# Patient Record
Sex: Male | Born: 1990 | Race: White | Hispanic: No | Marital: Single
Health system: Midwestern US, Academic
[De-identification: ages and names within clinical notes are randomized; demographics above are authoritative.]

---

## 2017-02-25 ENCOUNTER — Inpatient Hospital Stay
Admit: 2017-02-25 | Discharge: 2017-02-28 | Disposition: A | Discharge status: Home / Self Care | Source: Other Acute Inpatient Hospital

## 2017-02-25 ENCOUNTER — Encounter: Admit: 2017-02-25 | Discharge: 2017-02-25

## 2017-02-25 ENCOUNTER — Inpatient Hospital Stay: Admit: 2017-02-25 | Discharge: 2017-02-25

## 2017-02-25 DIAGNOSIS — R69 Illness, unspecified: Principal | ICD-10-CM

## 2017-02-25 DIAGNOSIS — S68119A Complete traumatic metacarpophalangeal amputation of unspecified finger, initial encounter: ICD-10-CM

## 2017-02-25 LAB — CBC AND DIFF
Lab: 0 10*3/uL (ref 0–0.20)
Lab: 0 10*3/uL (ref 0–0.45)
Lab: 0.5 10*3/uL (ref 0–0.80)
Lab: 1.2 10*3/uL (ref 1.0–4.8)
Lab: 9.7 10*3/uL — ABNORMAL HIGH (ref >60)

## 2017-02-25 MED ORDER — ONDANSETRON HCL (PF) 4 MG/2 ML IJ SOLN
INTRAVENOUS | 0 refills | Status: DC
Start: 2017-02-25 — End: 2017-02-26
  Administered 2017-02-26: 03:00:00 4 mg via INTRAVENOUS

## 2017-02-25 MED ORDER — LIDOCAINE (PF) 10 MG/ML (1 %) IJ SOLN
.1-2 mL | INTRAMUSCULAR | 0 refills | Status: DC | PRN
Start: 2017-02-25 — End: 2017-02-26

## 2017-02-25 MED ORDER — CEFAZOLIN 1 GRAM IJ SOLR
0 refills | Status: DC
Start: 2017-02-25 — End: 2017-02-26
  Administered 2017-02-25 – 2017-02-26 (×2): 2 g via INTRAVENOUS

## 2017-02-25 MED ORDER — HYDROMORPHONE (PF) 2 MG/ML IJ SYRG
.5-1 mg | INTRAVENOUS | 0 refills | Status: DC | PRN
Start: 2017-02-25 — End: 2017-02-28

## 2017-02-25 MED ORDER — HEPARIN (PORCINE) IN 5 % DEX 20,000 UNIT/500 ML (40 UNIT/ML) IV SOLP
850 [IU]/h | INTRAVENOUS | 0 refills | Status: DC
Start: 2017-02-25 — End: 2017-02-27

## 2017-02-25 MED ORDER — BUPIVACAINE 0.125% PCA PNC SYR
PERINEURAL | 0 refills | Status: DC
Start: 2017-02-25 — End: 2017-02-26
  Administered 2017-02-26 (×2): 50.000 mL via PERINEURAL

## 2017-02-25 MED ORDER — LIDOCAINE (PF) 200 MG/10 ML (2 %) IJ SYRG
0 refills | Status: DC
Start: 2017-02-25 — End: 2017-02-26
  Administered 2017-02-25: 23:00:00 100 mg via INTRAVENOUS

## 2017-02-25 MED ORDER — HEPARIN 10000UNITS NS 100ML IRR(OR)
Freq: Once | 0 refills | Status: AC
Start: 2017-02-25 — End: ?

## 2017-02-25 MED ORDER — MIDAZOLAM 1 MG/ML IJ SOLN
INTRAVENOUS | 0 refills | Status: DC
Start: 2017-02-25 — End: 2017-02-26
  Administered 2017-02-25: 23:00:00 2 mg via INTRAVENOUS

## 2017-02-25 MED ORDER — ROPIVACAINE (PF) 5 MG/ML (0.5 %) IJ SOLN
0 refills | Status: DC
Start: 2017-02-25 — End: 2017-02-26
  Administered 2017-02-26: 04:00:00 20 mL

## 2017-02-25 MED ORDER — HEPARIN 10000UNITS NS 100ML IRR(OR)
0 refills | Status: DC
Start: 2017-02-25 — End: 2017-02-26
  Administered 2017-02-25 (×2): 100 mL

## 2017-02-25 MED ORDER — SENNOSIDES-DOCUSATE SODIUM 8.6-50 MG PO TAB
1 | Freq: Two times a day (BID) | ORAL | 0 refills | Status: DC
Start: 2017-02-25 — End: 2017-02-28
  Administered 2017-02-26 – 2017-02-28 (×5): 1 via ORAL

## 2017-02-25 MED ORDER — ASPIRIN 81 MG PO CHEW
81 mg | Freq: Every day | ORAL | 0 refills | Status: DC
Start: 2017-02-25 — End: 2017-02-27
  Administered 2017-02-26: 14:00:00 81 mg via ORAL

## 2017-02-25 MED ORDER — KETAMINE 10 MG/ML IJ SOLN (INFUSION)(AM)(OR)
0 refills | Status: DC
Start: 2017-02-25 — End: 2017-02-26
  Administered 2017-02-26: 01:00:00 200 ug/kg/h via INTRAVENOUS

## 2017-02-25 MED ORDER — PROPOFOL INJ 10 MG/ML IV VIAL
0 refills | Status: DC
Start: 2017-02-25 — End: 2017-02-26
  Administered 2017-02-25: 23:00:00 200 mg via INTRAVENOUS

## 2017-02-25 MED ORDER — LACTATED RINGERS IV SOLP
1000 mL | INTRAVENOUS | 0 refills | Status: AC
Start: 2017-02-25 — End: ?
  Administered 2017-02-25 (×2): 1000.000 mL via INTRAVENOUS
  Administered 2017-02-25: 23:00:00 1000 mL via INTRAVENOUS
  Administered 2017-02-26: 02:00:00 1000.000 mL via INTRAVENOUS
  Administered 2017-02-26: 05:00:00 1000 mL via INTRAVENOUS

## 2017-02-25 MED ORDER — HEPARIN (PORCINE) IN 5 % DEX 20,000 UNIT/500 ML (40 UNIT/ML) IV SOLP (INFUSION)(AM)(OR)
0 refills | Status: DC
Start: 2017-02-25 — End: 2017-02-26
  Administered 2017-02-26: 02:00:00 650 [IU]/h via INTRAVENOUS

## 2017-02-25 MED ORDER — KETAMINE 10 MG/ML IJ SOLN
0 refills | Status: DC
Start: 2017-02-25 — End: 2017-02-26
  Administered 2017-02-25 – 2017-02-26 (×2): 30 mg via INTRAVENOUS

## 2017-02-25 MED ORDER — OXYCODONE 5 MG PO TAB
5-10 mg | ORAL | 0 refills | Status: DC | PRN
Start: 2017-02-25 — End: 2017-02-28
  Administered 2017-02-26: 19:00:00 5 mg via ORAL
  Administered 2017-02-27: 03:00:00 10 mg via ORAL

## 2017-02-25 MED ORDER — OXYCODONE 5 MG PO TAB
5 mg | ORAL | 0 refills | Status: DC | PRN
Start: 2017-02-25 — End: 2017-02-26

## 2017-02-25 MED ORDER — LACTATED RINGERS IV SOLP
0 refills | Status: DC
Start: 2017-02-25 — End: 2017-02-26
  Administered 2017-02-25: 23:00:00 via INTRAVENOUS

## 2017-02-25 MED ORDER — AMPICILLIN/SULBACTAM 1.5G/100ML NS IVPB (MB+)
1.5 g | INTRAVENOUS | 0 refills | Status: CP
Start: 2017-02-25 — End: ?
  Administered 2017-02-26 – 2017-02-28 (×16): 1.5 g via INTRAVENOUS

## 2017-02-25 MED ORDER — SUCCINYLCHOLINE CHLORIDE 20 MG/ML IJ SOLN
INTRAVENOUS | 0 refills | Status: DC
Start: 2017-02-25 — End: 2017-02-26
  Administered 2017-02-25: 23:00:00 140 mg via INTRAVENOUS

## 2017-02-25 MED ORDER — ACETAMINOPHEN 1,000 MG/100 ML (10 MG/ML) IV SOLN
0 refills | Status: DC
Start: 2017-02-25 — End: 2017-02-26
  Administered 2017-02-26: 03:00:00 1000 mg via INTRAVENOUS

## 2017-02-25 MED ORDER — ASPIRIN 300 MG RE SUPP
0 refills | Status: DC
Start: 2017-02-25 — End: 2017-02-26
  Administered 2017-02-25: 150 mg via RECTAL

## 2017-02-25 MED ORDER — ACETAMINOPHEN 500 MG PO TAB
1000 mg | ORAL | 0 refills | Status: DC
Start: 2017-02-25 — End: 2017-02-28
  Administered 2017-02-26 – 2017-02-27 (×3): 1000 mg via ORAL
  Administered 2017-02-27: 01:00:00 500 mg via ORAL
  Administered 2017-02-27 – 2017-02-28 (×4): 1000 mg via ORAL

## 2017-02-25 MED ORDER — DEXMEDETOMIDINE IV DRIP (STD CONC)
0 refills | Status: DC
Start: 2017-02-25 — End: 2017-02-26
  Administered 2017-02-26 (×2): 0.2 ug/kg/h via INTRAVENOUS

## 2017-02-25 MED ORDER — ACETAMINOPHEN 500 MG PO TAB
1000 mg | ORAL | 0 refills | Status: DC
Start: 2017-02-25 — End: 2017-02-26
  Administered 2017-02-26: 06:00:00 500 mg via ORAL

## 2017-02-25 MED ORDER — FENTANYL CITRATE (PF) 50 MCG/ML IJ SOLN
0 refills | Status: DC
Start: 2017-02-25 — End: 2017-02-26
  Administered 2017-02-25: 50 ug via INTRAVENOUS
  Administered 2017-02-25 (×2): 100 ug via INTRAVENOUS
  Administered 2017-02-26: 25 ug via INTRAVENOUS
  Administered 2017-02-26: 01:00:00 75 ug via INTRAVENOUS

## 2017-02-25 MED ORDER — HYDROXYZINE HCL 25 MG PO TAB
25 mg | ORAL | 0 refills | Status: DC | PRN
Start: 2017-02-25 — End: 2017-02-28

## 2017-02-25 MED ORDER — HEPARIN (PORCINE) 1,000 UNIT/ML IJ SOLN
4000 [IU] | Freq: Once | INTRAVENOUS | 0 refills | Status: CP | PRN
Start: 2017-02-25 — End: ?
  Administered 2017-02-26: 02:00:00 4000 [IU] via INTRAVENOUS

## 2017-02-25 MED ORDER — KETOROLAC 15 MG/ML IJ SOLN
15 mg | INTRAVENOUS | 0 refills | Status: DC
Start: 2017-02-25 — End: 2017-02-28
  Administered 2017-02-26 – 2017-02-28 (×8): 15 mg via INTRAVENOUS

## 2017-02-25 MED ORDER — LIDOCAINE HCL 4 % (40 MG/ML) MM SOLN
0 refills | Status: DC
Start: 2017-02-25 — End: 2017-02-26
  Administered 2017-02-25: 10 mL via TOPICAL

## 2017-02-25 MED ORDER — POLYETHYLENE GLYCOL 3350 17 GRAM PO PWPK
17 g | Freq: Every day | ORAL | 0 refills | Status: DC
Start: 2017-02-25 — End: 2017-02-28
  Administered 2017-02-28: 15:00:00 17 g via ORAL

## 2017-02-25 MED ORDER — LACTATED RINGERS IV SOLP
INTRAVENOUS | 0 refills | Status: AC
Start: 2017-02-25 — End: ?
  Administered 2017-02-26 – 2017-02-27 (×5): 1000.000 mL via INTRAVENOUS

## 2017-02-25 MED ORDER — DIPHENHYDRAMINE HCL 50 MG/ML IJ SOLN
25 mg | INTRAVENOUS | 0 refills | Status: DC | PRN
Start: 2017-02-25 — End: 2017-02-28

## 2017-02-25 MED ORDER — ONDANSETRON HCL (PF) 4 MG/2 ML IJ SOLN
4 mg | INTRAVENOUS | 0 refills | Status: DC | PRN
Start: 2017-02-25 — End: 2017-02-28

## 2017-02-26 LAB — BASIC METABOLIC PANEL
Lab: 139 MMOL/L — ABNORMAL LOW (ref >60)
Lab: 3.4 MMOL/L — ABNORMAL LOW (ref >60)

## 2017-02-26 LAB — PTT (APTT): Lab: 50 s — ABNORMAL HIGH (ref 24.0–36.5)

## 2017-02-26 LAB — CBC
Lab: 3.6 M/UL — ABNORMAL LOW (ref 4.4–5.5)
Lab: 9.8 K/UL — ABNORMAL HIGH (ref 4.5–11.0)

## 2017-02-26 MED ORDER — POTASSIUM CHLORIDE IN WATER 10 MEQ/50 ML IV PGBK
10 meq | INTRAVENOUS | 0 refills | Status: CP
Start: 2017-02-26 — End: ?
  Administered 2017-02-26 (×6): 10 meq via INTRAVENOUS

## 2017-02-26 MED ORDER — BUPIVACAINE 0.125% PCA PNC SYR
PERINEURAL | 0 refills | Status: DC
Start: 2017-02-26 — End: 2017-02-28
  Administered 2017-02-26 – 2017-02-28 (×9): 50.000 mL via PERINEURAL

## 2017-02-26 MED ORDER — LORAZEPAM 2 MG/ML IJ SOLN
1 mg | Freq: Once | INTRAVENOUS | 0 refills | Status: CP
Start: 2017-02-26 — End: ?
  Administered 2017-02-26: 08:00:00 1 mg via INTRAVENOUS

## 2017-02-27 ENCOUNTER — Encounter: Admit: 2017-02-27 | Discharge: 2017-02-27

## 2017-02-27 ENCOUNTER — Inpatient Hospital Stay: Admit: 2017-02-27 | Discharge: 2017-02-27

## 2017-02-27 LAB — BASIC METABOLIC PANEL: Lab: 140 MMOL/L — ABNORMAL LOW (ref 137–147)

## 2017-02-27 LAB — CBC: Lab: 6.6 K/UL — ABNORMAL HIGH (ref 4.5–11.0)

## 2017-02-27 MED ORDER — FENTANYL CITRATE (PF) 50 MCG/ML IJ SOLN
50 ug | INTRAVENOUS | 0 refills | Status: DC | PRN
Start: 2017-02-27 — End: 2017-02-28
  Administered 2017-02-28 (×2): 25 ug via INTRAVENOUS

## 2017-02-27 MED ORDER — PROPOFOL INJ 10 MG/ML IV VIAL
0 refills | Status: DC
Start: 2017-02-27 — End: 2017-02-28
  Administered 2017-02-27: 23:00:00 300 mg via INTRAVENOUS

## 2017-02-27 MED ORDER — TRAZODONE 50 MG PO TAB
50 mg | Freq: Every evening | ORAL | 0 refills | Status: DC | PRN
Start: 2017-02-27 — End: 2017-02-28
  Administered 2017-02-28: 06:00:00 50 mg via ORAL

## 2017-02-27 MED ORDER — HALOPERIDOL LACTATE 5 MG/ML IJ SOLN
1 mg | Freq: Once | INTRAVENOUS | 0 refills | Status: DC | PRN
Start: 2017-02-27 — End: 2017-02-28

## 2017-02-27 MED ORDER — MIDAZOLAM 1 MG/ML IJ SOLN
INTRAVENOUS | 0 refills | Status: DC
Start: 2017-02-27 — End: 2017-02-28
  Administered 2017-02-27: 23:00:00 2 mg via INTRAVENOUS

## 2017-02-27 MED ORDER — LACTATED RINGERS IV SOLP
1000 mL | INTRAVENOUS | 0 refills | Status: DC
Start: 2017-02-27 — End: 2017-02-28
  Administered 2017-02-27: 23:00:00 1000 mL via INTRAVENOUS

## 2017-02-27 MED ORDER — LIDOCAINE (PF) 200 MG/10 ML (2 %) IJ SYRG
0 refills | Status: DC
Start: 2017-02-27 — End: 2017-02-28
  Administered 2017-02-27: 23:00:00 100 mg via INTRAVENOUS

## 2017-02-27 MED ORDER — ONDANSETRON HCL (PF) 4 MG/2 ML IJ SOLN
INTRAVENOUS | 0 refills | Status: DC
Start: 2017-02-27 — End: 2017-02-28
  Administered 2017-02-27: 4 mg via INTRAVENOUS

## 2017-02-27 MED ORDER — ROPIVACAINE 0.2% INFUSION (ON-Q PUMP CB004/P400X2-14)
PERINEURAL | 0 refills | Status: DC
Start: 2017-02-27 — End: 2017-02-27

## 2017-02-27 MED ORDER — DEXAMETHASONE SODIUM PHOSPHATE 4 MG/ML IJ SOLN
INTRAVENOUS | 0 refills | Status: DC
Start: 2017-02-27 — End: 2017-02-28
  Administered 2017-02-27: 23:00:00 4 mg via INTRAVENOUS

## 2017-02-27 MED ORDER — DEXTRAN 70-HYPROMELLOSE (PF) 0.1-0.3 % OP DPET
0 refills | Status: DC
Start: 2017-02-27 — End: 2017-02-28
  Administered 2017-02-27: 23:00:00 2 [drp] via OPHTHALMIC

## 2017-02-27 MED ORDER — BUPIVACAINE 0.5 % (5 MG/ML) IJ SOLN
0 refills | Status: DC
Start: 2017-02-27 — End: 2017-02-28
  Administered 2017-02-27: 23:00:00 10 mL

## 2017-02-27 MED ORDER — FENTANYL CITRATE (PF) 50 MCG/ML IJ SOLN
0 refills | Status: DC
Start: 2017-02-27 — End: 2017-02-28
  Administered 2017-02-27: 23:00:00 50 ug via INTRAVENOUS

## 2017-02-27 MED ORDER — CEFAZOLIN 1 GRAM IJ SOLR
0 refills | Status: DC
Start: 2017-02-27 — End: 2017-02-28
  Administered 2017-02-27: 23:00:00 2 g via INTRAVENOUS

## 2017-02-27 MED ORDER — LIDOCAINE (PF) 10 MG/ML (1 %) IJ SOLN
.1-2 mL | INTRAMUSCULAR | 0 refills | Status: DC | PRN
Start: 2017-02-27 — End: 2017-02-28

## 2017-02-27 MED ORDER — OXYCODONE 5 MG PO TAB
5-10 mg | Freq: Once | ORAL | 0 refills | Status: DC | PRN
Start: 2017-02-27 — End: 2017-02-28

## 2017-02-28 DIAGNOSIS — S6402XA Injury of ulnar nerve at wrist and hand level of left arm, initial encounter: Principal | ICD-10-CM

## 2017-02-28 DIAGNOSIS — S68613A Complete traumatic transphalangeal amputation of left middle finger, initial encounter: ICD-10-CM

## 2017-02-28 DIAGNOSIS — S62522B Displaced fracture of distal phalanx of left thumb, initial encounter for open fracture: ICD-10-CM

## 2017-02-28 DIAGNOSIS — S62625B Displaced fracture of medial phalanx of left ring finger, initial encounter for open fracture: ICD-10-CM

## 2017-02-28 DIAGNOSIS — G8911 Acute pain due to trauma: ICD-10-CM

## 2017-02-28 DIAGNOSIS — S61012A Laceration without foreign body of left thumb without damage to nail, initial encounter: ICD-10-CM

## 2017-02-28 DIAGNOSIS — S61215A Laceration without foreign body of left ring finger without damage to nail, initial encounter: ICD-10-CM

## 2017-02-28 DIAGNOSIS — S68611A Complete traumatic transphalangeal amputation of left index finger, initial encounter: ICD-10-CM

## 2017-02-28 LAB — POC GLUCOSE: Lab: 192 mg/dL — ABNORMAL HIGH (ref 70–100)

## 2017-02-28 LAB — VRE SCREEN

## 2017-02-28 LAB — CBC
Lab: 3.3 M/UL — ABNORMAL LOW (ref 4.4–5.5)
Lab: 8.1 K/UL — ABNORMAL LOW (ref 4.5–11.0)

## 2017-02-28 LAB — BASIC METABOLIC PANEL: Lab: 139 MMOL/L — ABNORMAL LOW (ref 137–147)

## 2017-02-28 LAB — MRSA SCREEN

## 2017-02-28 MED ORDER — GABAPENTIN 300 MG PO CAP
300 mg | ORAL_CAPSULE | ORAL | 1 refills | Status: AC
Start: 2017-02-28 — End: 2017-04-11

## 2017-02-28 MED ORDER — ACETAMINOPHEN 500 MG PO TAB
1000 mg | ORAL_TABLET | ORAL | 1 refills | Status: AC | PRN
Start: 2017-02-28 — End: 2017-04-11

## 2017-02-28 MED ORDER — SENNOSIDES-DOCUSATE SODIUM 8.6-50 MG PO TAB
1 | ORAL_TABLET | Freq: Two times a day (BID) | ORAL | 1 refills | Status: AC
Start: 2017-02-28 — End: 2017-03-30

## 2017-02-28 MED ORDER — POLYETHYLENE GLYCOL 3350 17 GRAM PO PWPK
17 g | Freq: Every day | ORAL | 3 refills | 18.00000 days | Status: AC
Start: 2017-02-28 — End: 2017-03-16

## 2017-02-28 MED ORDER — AMOXICILLIN-POT CLAVULANATE 875-125 MG PO TAB
1 | ORAL_TABLET | Freq: Two times a day (BID) | ORAL | 0 refills | 7.00000 days | Status: AC
Start: 2017-02-28 — End: ?

## 2017-03-01 ENCOUNTER — Encounter: Admit: 2017-03-01 | Discharge: 2017-03-01

## 2017-03-02 ENCOUNTER — Encounter: Admit: 2017-03-02 | Discharge: 2017-03-02

## 2017-03-03 ENCOUNTER — Encounter: Admit: 2017-03-03 | Discharge: 2017-03-03

## 2017-03-07 ENCOUNTER — Encounter: Admit: 2017-03-07 | Discharge: 2017-03-07

## 2017-03-07 DIAGNOSIS — R69 Illness, unspecified: Principal | ICD-10-CM

## 2017-03-16 ENCOUNTER — Encounter: Admit: 2017-03-16 | Discharge: 2017-03-16

## 2017-03-16 ENCOUNTER — Encounter: Admit: 2017-03-16 | Discharge: 2017-04-07

## 2017-03-16 ENCOUNTER — Ambulatory Visit: Admit: 2017-03-16 | Discharge: 2017-03-16

## 2017-03-16 ENCOUNTER — Ambulatory Visit: Admit: 2017-03-16 | Discharge: 2017-03-17

## 2017-03-16 DIAGNOSIS — S6992XD Unspecified injury of left wrist, hand and finger(s), subsequent encounter: Principal | ICD-10-CM

## 2017-03-16 DIAGNOSIS — S6992XS Unspecified injury of left wrist, hand and finger(s), sequela: Principal | ICD-10-CM

## 2017-03-16 DIAGNOSIS — S68119A Complete traumatic metacarpophalangeal amputation of unspecified finger, initial encounter: Secondary | ICD-10-CM

## 2017-03-30 ENCOUNTER — Ambulatory Visit: Admit: 2017-03-30 | Discharge: 2017-03-30

## 2017-03-30 ENCOUNTER — Encounter: Admit: 2017-03-30 | Discharge: 2017-03-30

## 2017-03-30 DIAGNOSIS — S68119D Complete traumatic metacarpophalangeal amputation of unspecified finger, subsequent encounter: Principal | ICD-10-CM

## 2017-04-07 ENCOUNTER — Encounter: Admit: 2017-03-30 | Discharge: 2017-03-30

## 2017-04-07 DIAGNOSIS — S68119A Complete traumatic metacarpophalangeal amputation of unspecified finger, initial encounter: ICD-10-CM

## 2017-04-07 DIAGNOSIS — S6992XS Unspecified injury of left wrist, hand and finger(s), sequela: Principal | ICD-10-CM

## 2017-04-11 ENCOUNTER — Encounter: Admit: 2017-04-11 | Discharge: 2017-05-08

## 2017-04-11 ENCOUNTER — Ambulatory Visit: Admit: 2017-04-11 | Discharge: 2017-04-11

## 2017-04-11 ENCOUNTER — Encounter: Admit: 2017-04-11 | Discharge: 2017-04-11

## 2017-04-11 DIAGNOSIS — S6992XD Unspecified injury of left wrist, hand and finger(s), subsequent encounter: Principal | ICD-10-CM

## 2017-04-25 ENCOUNTER — Encounter: Admit: 2017-04-25 | Discharge: 2017-04-25

## 2017-05-08 DIAGNOSIS — S68119A Complete traumatic metacarpophalangeal amputation of unspecified finger, initial encounter: Secondary | ICD-10-CM

## 2017-05-08 DIAGNOSIS — S6992XD Unspecified injury of left wrist, hand and finger(s), subsequent encounter: Principal | ICD-10-CM

## 2017-05-12 ENCOUNTER — Encounter: Admit: 2017-05-12 | Discharge: 2017-05-12

## 2017-05-25 ENCOUNTER — Encounter: Admit: 2017-05-25 | Discharge: 2017-05-25

## 2017-05-25 ENCOUNTER — Encounter: Admit: 2017-05-25 | Discharge: 2017-06-07

## 2017-05-25 ENCOUNTER — Ambulatory Visit: Admit: 2017-05-25 | Discharge: 2017-05-25

## 2017-05-25 DIAGNOSIS — S68119D Complete traumatic metacarpophalangeal amputation of unspecified finger, subsequent encounter: Principal | ICD-10-CM

## 2017-05-25 DIAGNOSIS — S68119A Complete traumatic metacarpophalangeal amputation of unspecified finger, initial encounter: Principal | ICD-10-CM

## 2017-08-24 ENCOUNTER — Encounter: Admit: 2017-08-24 | Discharge: 2017-08-24

## 2017-10-17 IMAGING — CT Spine^1_C_SPINE (Adult)
1 series · 12 of 14 positions shown, 15 images · non-contrast
Comparison: none

[Series 3: c-spine 1.5 soft tissue · axial · 0.28mm/px · z∈[+180,+341]mm · 12 of 125 slices shown, 15 images]
[im 10/125  soft-tissue]
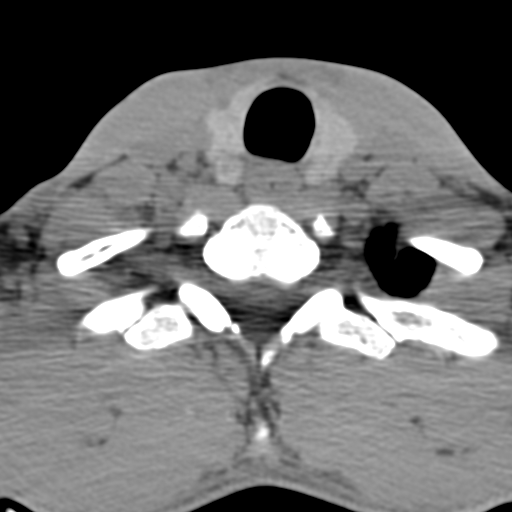
[im 10/125  bone]
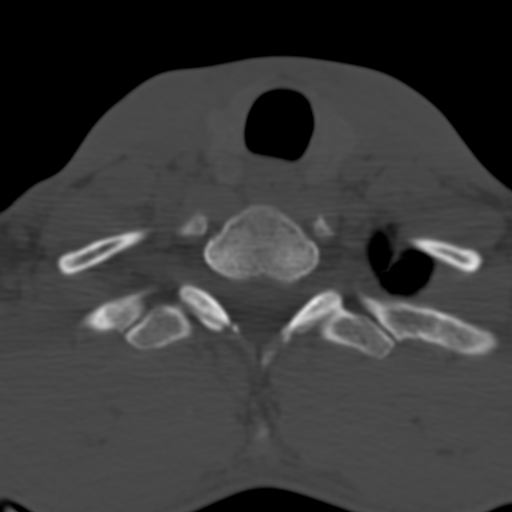
[im 20/125  bone]
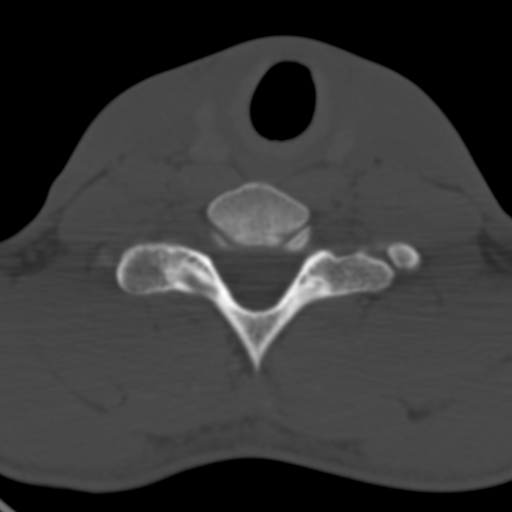
[im 29/125  bone]
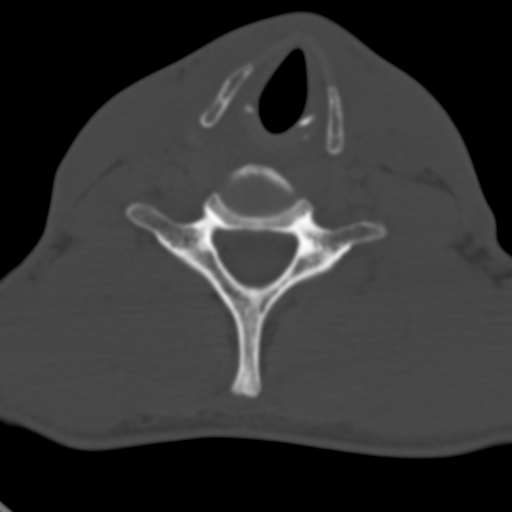
[im 39/125  bone]
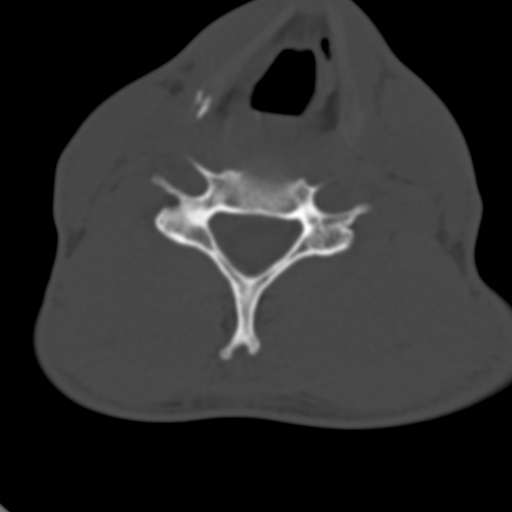
[im 48/125  soft-tissue]
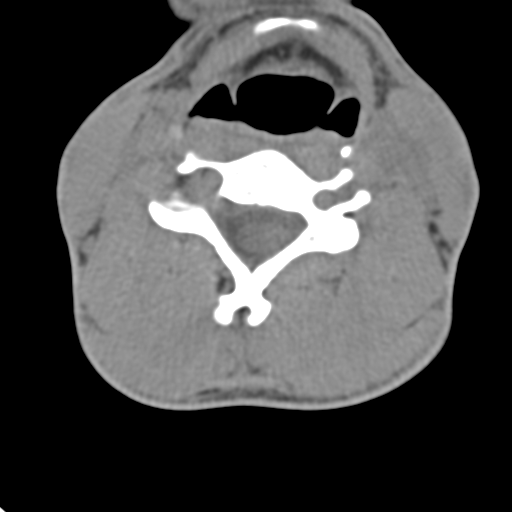
[im 48/125  bone]
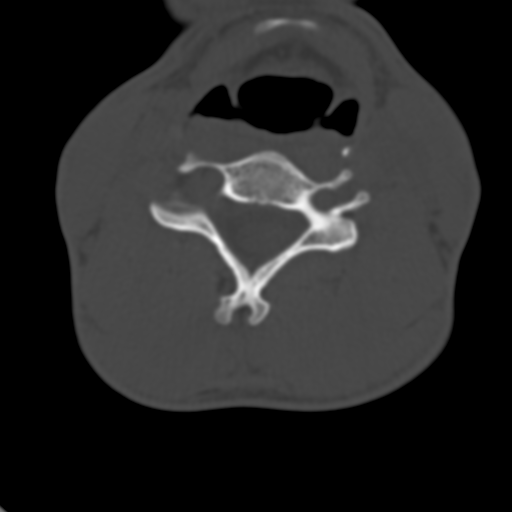
[im 58/125  bone]
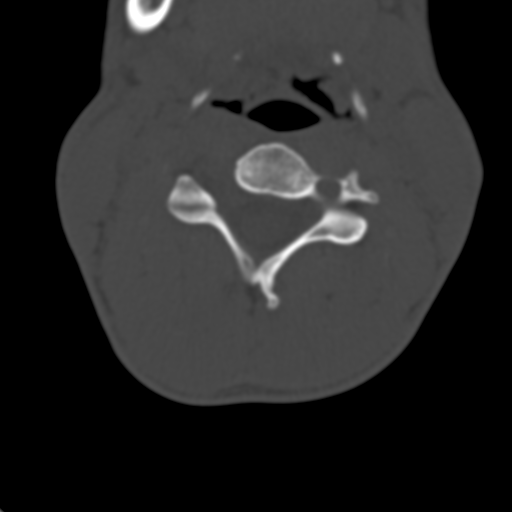
[im 67/125  bone]
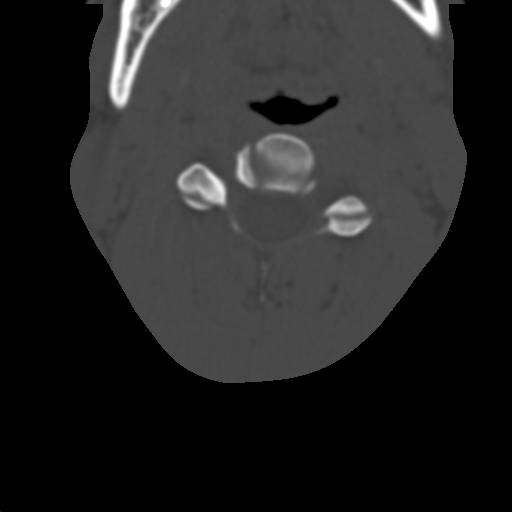
[im 77/125  bone]
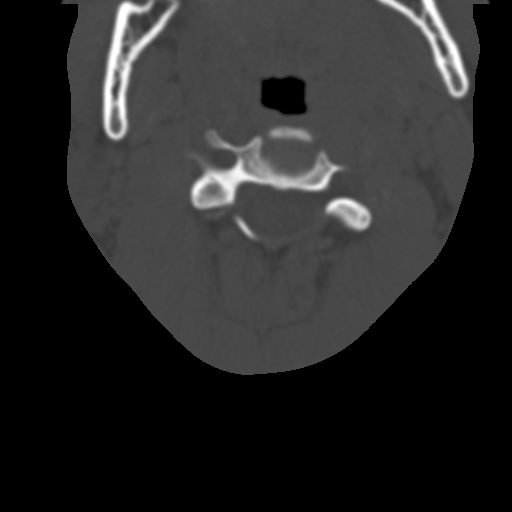
[im 86/125  soft-tissue]
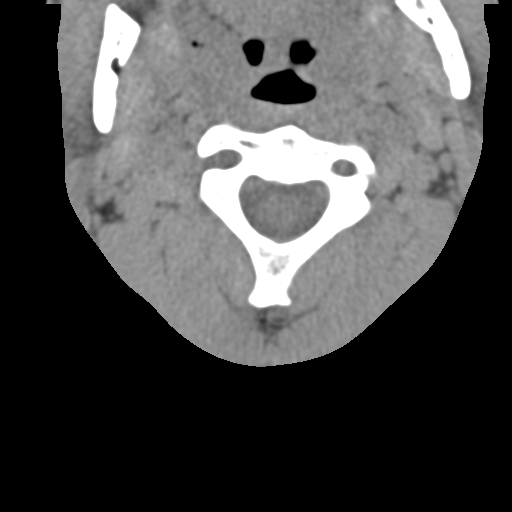
[im 86/125  bone]
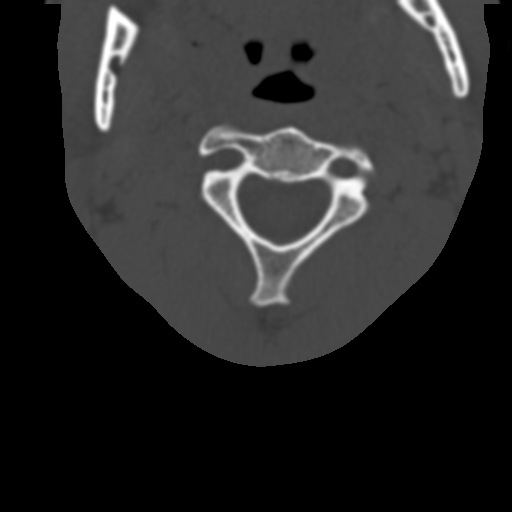
[im 96/125  bone]
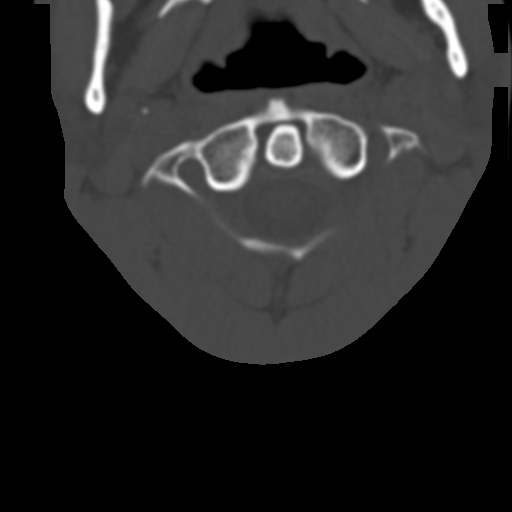
[im 105/125  bone]
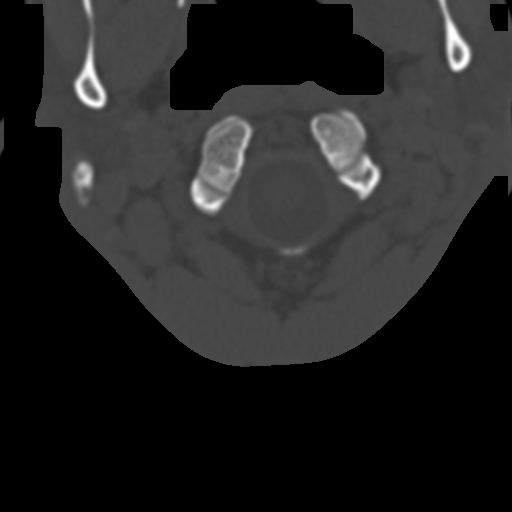
[im 115/125  bone]
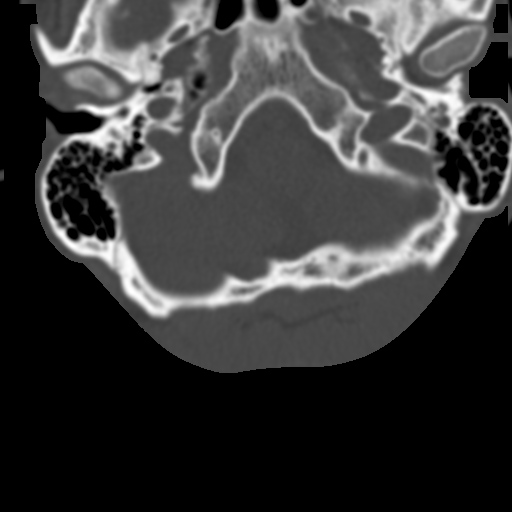

[12 of 14 positions shown; findings below may reference images not displayed]

EXAM

CT cervical spine without contrast

INDICATION

cervical neck pain, injury
PT REPORTS HEAVY PLYWOOD FELL ON HIS HEAD, CAUSING HIM TO FALL BACKWARD
FROM LADDER 15 DAYS AGO. NECK PAIN AND STIFFNESSX 2 DAYS, THAT WENT AWAY
THEN RETURNED 10 DAYS AGO AND IN INCREASING. DECREASED ROM. AB

TECHNIQUE

Volumetric multi detector CT images of the cervical spine were obtained without the administration
of IV contrast.

All CT scans at this facility use dose modulation, iterative reconstruction, and/or weight based
dosing when appropriate to reduce radiation dose to as low as reasonably achievable.

COMPARISONS

None available.

FINDINGS

The cervical vertebral body heights are grossly maintained with mild straightening of the normal
cervical lordosis. There is no evidence of displaced fracture or dislocation. There is no
significant degenerative disc disease. There is no significant spinal canal stenosis or neural
foraminal narrowing. The facets remain well imbricated. The lung apices are clear. The paraspinous
soft tissues are grossly within normal limits.

IMPRESSION

1. Mild straightening of the normal cervical lordosis which may represent position versus spasm.
No evidence of displaced fracture. If there remains persistent clinical concern or clinical
symptoms consider further evaluation with MRI.

## 2018-04-02 ENCOUNTER — Emergency Department: Admit: 2018-04-02 | Discharge: 2018-04-02

## 2018-04-02 ENCOUNTER — Encounter: Admit: 2018-04-02 | Discharge: 2018-04-02

## 2018-04-02 LAB — AMPHETAMINES-URINE RANDOM: Lab: NEGATIVE mg/dL (ref 70–100)

## 2018-04-02 LAB — URINALYSIS DIPSTICK
Lab: NEGATIVE
Lab: NEGATIVE
Lab: NEGATIVE
Lab: 8 (ref 5.0–8.0)
Lab: NEGATIVE
Lab: NEGATIVE
Lab: NEGATIVE
Lab: NEGATIVE

## 2018-04-02 LAB — SALICYLATE LEVEL

## 2018-04-02 LAB — POC TROPONIN: Lab: 0 ng/mL (ref 0.00–0.05)

## 2018-04-02 LAB — OPIATES 300 OR GREATER-URINE RANDOM: Lab: NEGATIVE U/L (ref 7–40)

## 2018-04-02 LAB — COMPREHENSIVE METABOLIC PANEL
Lab: 137 MMOL/L (ref 137–147)
Lab: 26 MMOL/L (ref 21–30)

## 2018-04-02 LAB — CANNABINOIDS-URINE RANDOM: Lab: POSITIVE mg/dL — AB (ref 8.5–10.6)

## 2018-04-02 LAB — BENZODIAZEPINES-URINE RANDOM: Lab: NEGATIVE mg/dL (ref 0.4–1.24)

## 2018-04-02 LAB — ALCOHOL LEVEL: Lab: <10 mg/dL (ref 98–110)

## 2018-04-02 LAB — ACETAMINOPHEN LEVEL: Lab: <10 ug/mL (ref <20.1)

## 2018-04-02 LAB — COCAINE-URINE RANDOM: Lab: NEGATIVE g/dL (ref 6.0–8.0)

## 2018-04-02 LAB — TSH WITH FREE T4 REFLEX: Lab: 0.8 uU/mL (ref 0.35–5.00)

## 2018-04-02 LAB — CBC AND DIFF: Lab: 7.2 10*3/uL (ref 4.5–11.0)

## 2018-04-02 LAB — METHADONE-URINE SCREEN: Lab: NEGATIVE g/dL (ref 3.5–5.0)

## 2018-04-02 LAB — URINALYSIS, MICROSCOPIC

## 2018-04-02 LAB — BARBITURATES-URINE RANDOM: Lab: NEGATIVE mg/dL (ref 7–25)

## 2018-04-02 LAB — TRICYCLIC SCREEN: Lab: NEGATIVE mL/min (ref 1.0–4.8)

## 2018-04-02 LAB — OXYCODONE URINE SCREEN: Lab: NEGATIVE U/L (ref 25–110)

## 2018-04-02 LAB — PHENCYCLIDINES-URINE RANDOM: Lab: NEGATIVE mg/dL (ref 0.3–1.2)

## 2018-04-02 MED ORDER — DIPHENHYDRAMINE HCL 12.5 MG/5 ML PO ELIX
25 mg | Freq: Once | ORAL | 0 refills | Status: CP
Start: 2018-04-02 — End: ?
  Administered 2018-04-03: 05:00:00 25 mg via ORAL

## 2018-04-02 MED ORDER — ACETAMINOPHEN 500 MG PO TAB
1000 mg | Freq: Once | ORAL | 0 refills | Status: CP
Start: 2018-04-02 — End: ?
  Administered 2018-04-02: 21:00:00 1000 mg via ORAL

## 2018-04-03 ENCOUNTER — Emergency Department: Admit: 2018-04-02 | Discharge: 2018-04-03 | Disposition: A | Discharge status: Other Acute Inpatient Hospital

## 2018-04-03 DIAGNOSIS — F329 Major depressive disorder, single episode, unspecified: ICD-10-CM

## 2018-04-03 DIAGNOSIS — R45851 Suicidal ideations: Principal | ICD-10-CM

## 2018-04-03 DIAGNOSIS — F4321 Adjustment disorder with depressed mood: ICD-10-CM

## 2018-04-03 DIAGNOSIS — F1220 Cannabis dependence, uncomplicated: ICD-10-CM

## 2018-04-03 DIAGNOSIS — F603 Borderline personality disorder: ICD-10-CM

## 2018-04-03 DIAGNOSIS — F1020 Alcohol dependence, uncomplicated: ICD-10-CM

## 2018-04-03 MED ORDER — LORAZEPAM 1 MG PO TAB
2 mg | ORAL | 0 refills | Status: CN
Start: 2018-04-03 — End: ?

## 2018-04-03 MED ORDER — LORAZEPAM 1 MG PO TAB
1 mg | ORAL | 0 refills | Status: CN
Start: 2018-04-03 — End: ?

## 2018-04-03 MED ORDER — LORAZEPAM 1 MG PO TAB
1 mg | Freq: Two times a day (BID) | ORAL | 0 refills | Status: CN
Start: 2018-04-03 — End: ?

## 2018-04-03 MED ORDER — FOLIC ACID 1 MG PO TAB
1 mg | Freq: Every day | ORAL | 0 refills | Status: DC
Start: 2018-04-03 — End: 2018-04-03
  Administered 2018-04-03: 17:00:00 1 mg via ORAL

## 2018-04-03 MED ORDER — THIAMINE MONONITRATE (VIT B1) 100 MG PO TAB
100 mg | Freq: Every day | ORAL | 0 refills | Status: DC
Start: 2018-04-03 — End: 2018-04-03
  Administered 2018-04-03: 17:00:00 100 mg via ORAL

## 2018-04-03 MED ORDER — LORAZEPAM 1 MG PO TAB
1-2 mg | ORAL | 0 refills | Status: CN | PRN
Start: 2018-04-03 — End: ?

## 2018-04-03 MED ORDER — LORAZEPAM 1 MG PO TAB
.5-1 mg | Freq: Every day | ORAL | 0 refills | Status: DC | PRN
Start: 2018-04-03 — End: 2018-04-03
  Administered 2018-04-03: 17:00:00 1 mg via ORAL

## 2018-04-03 NOTE — Progress Notes
Patient requested and received Ativan 1 mg for anxiety.  this little room is killing me, I am about to have a panic attack  Patient verbally de escalated and offered coping skills for anxiety.

## 2018-04-03 NOTE — Consults
[facility] was about 5 years ago.  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 Marital status: Single     Spouse name: Not on file   ??? Number of children: Not on file   ??? Years of education: Not on file   ??? Highest education level: Not on file   Occupational History   ??? Not on file   Tobacco Use   ??? Smoking status: Current Every Day Smoker     Packs/day: 0.50     Types: Cigarettes   ??? Smokeless tobacco: Never Used   Substance and Sexual Activity   ??? Alcohol use: Yes     Comment: rare   ??? Drug use: Yes     Types: Marijuana     Comment: Intermittent cocaine in history   ??? Sexual activity: Not on file   Other Topics Concern   ??? Not on file   Social History Narrative   ??? Not on file         Past Medical/Surgical History:  No past medical history on file.:    Surgical History:   Procedure Laterality Date   ??? OPEN TREATMENT OF ARTICULAR FRACTURE LEFT THUMB DISTAL PHALANX, DEBRIDEMENT SKIN, FASCIA, BONE OF LEFT THUMB AND LEFT RING FINGER OPEN FRACUTURE, REPLANTATION LEFT INDEX FINGER, REPAIR ULNAR DIGITAL NERVE THUMB, AMPUTATION LEFT LONG FINGER WITH LOCAL FLAP, OPEN TREATMENT OF

## 2018-04-03 NOTE — ED Notes
[facility] [employer] contacted for transportation, over 8 hour wait time. [facility] contacted for transportation.  Estimated pickup at 1415-1430.

## 2018-04-03 NOTE — ED Notes
He was transported to [facility] by [facility]. He had his belongings and he went through his belongings before leaving. AWAS score of 3 when he left.

## 2018-04-03 NOTE — Progress Notes
PRN Medication Administered:  Ativan 1 mg  Non pharmacological interventions attempted prior to PRN medication administration:  verbal de escalation and one to one intervention with staff.    Brief narrative of reason for PRN medication administration:   Patient was anxious about being in this little room for a long time and I might have a panic attack

## 2018-10-06 IMAGING — CR UP_EXM
2 series · 2 of 2 positions shown · non-contrast
Comparison: none

[hand]
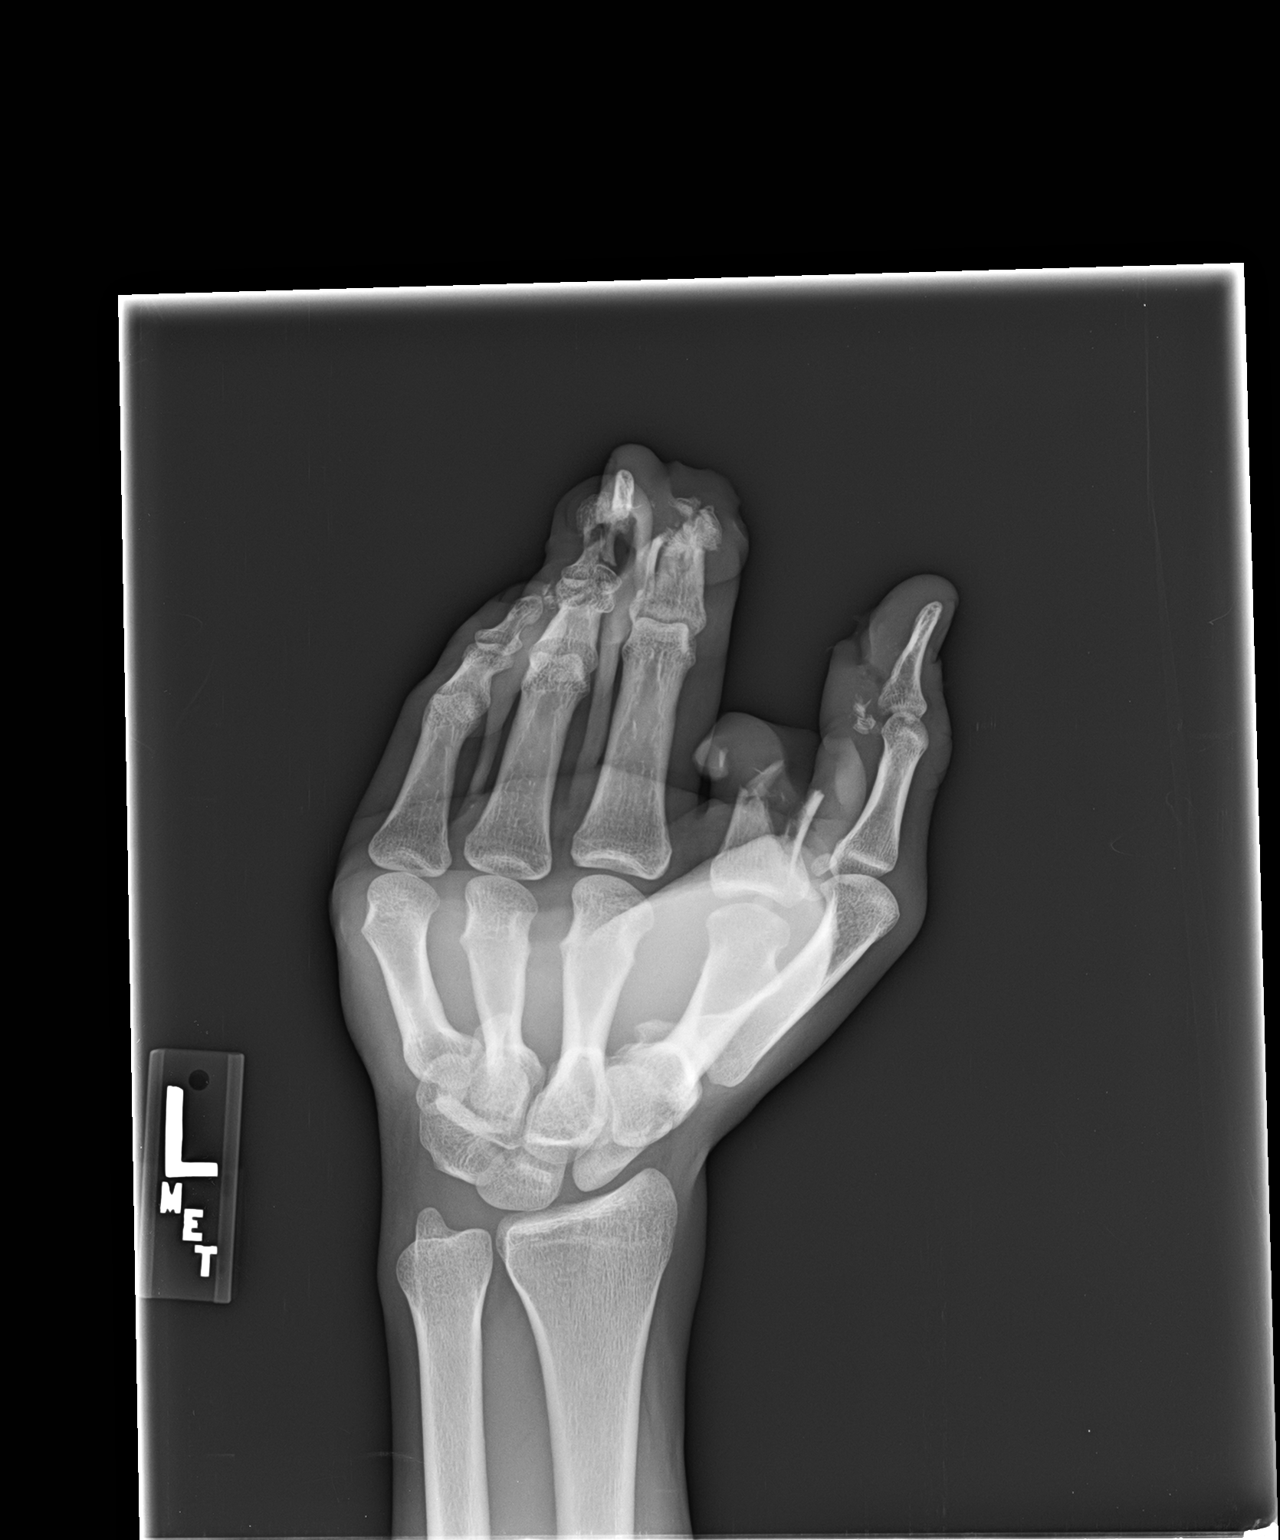

[hand obl]
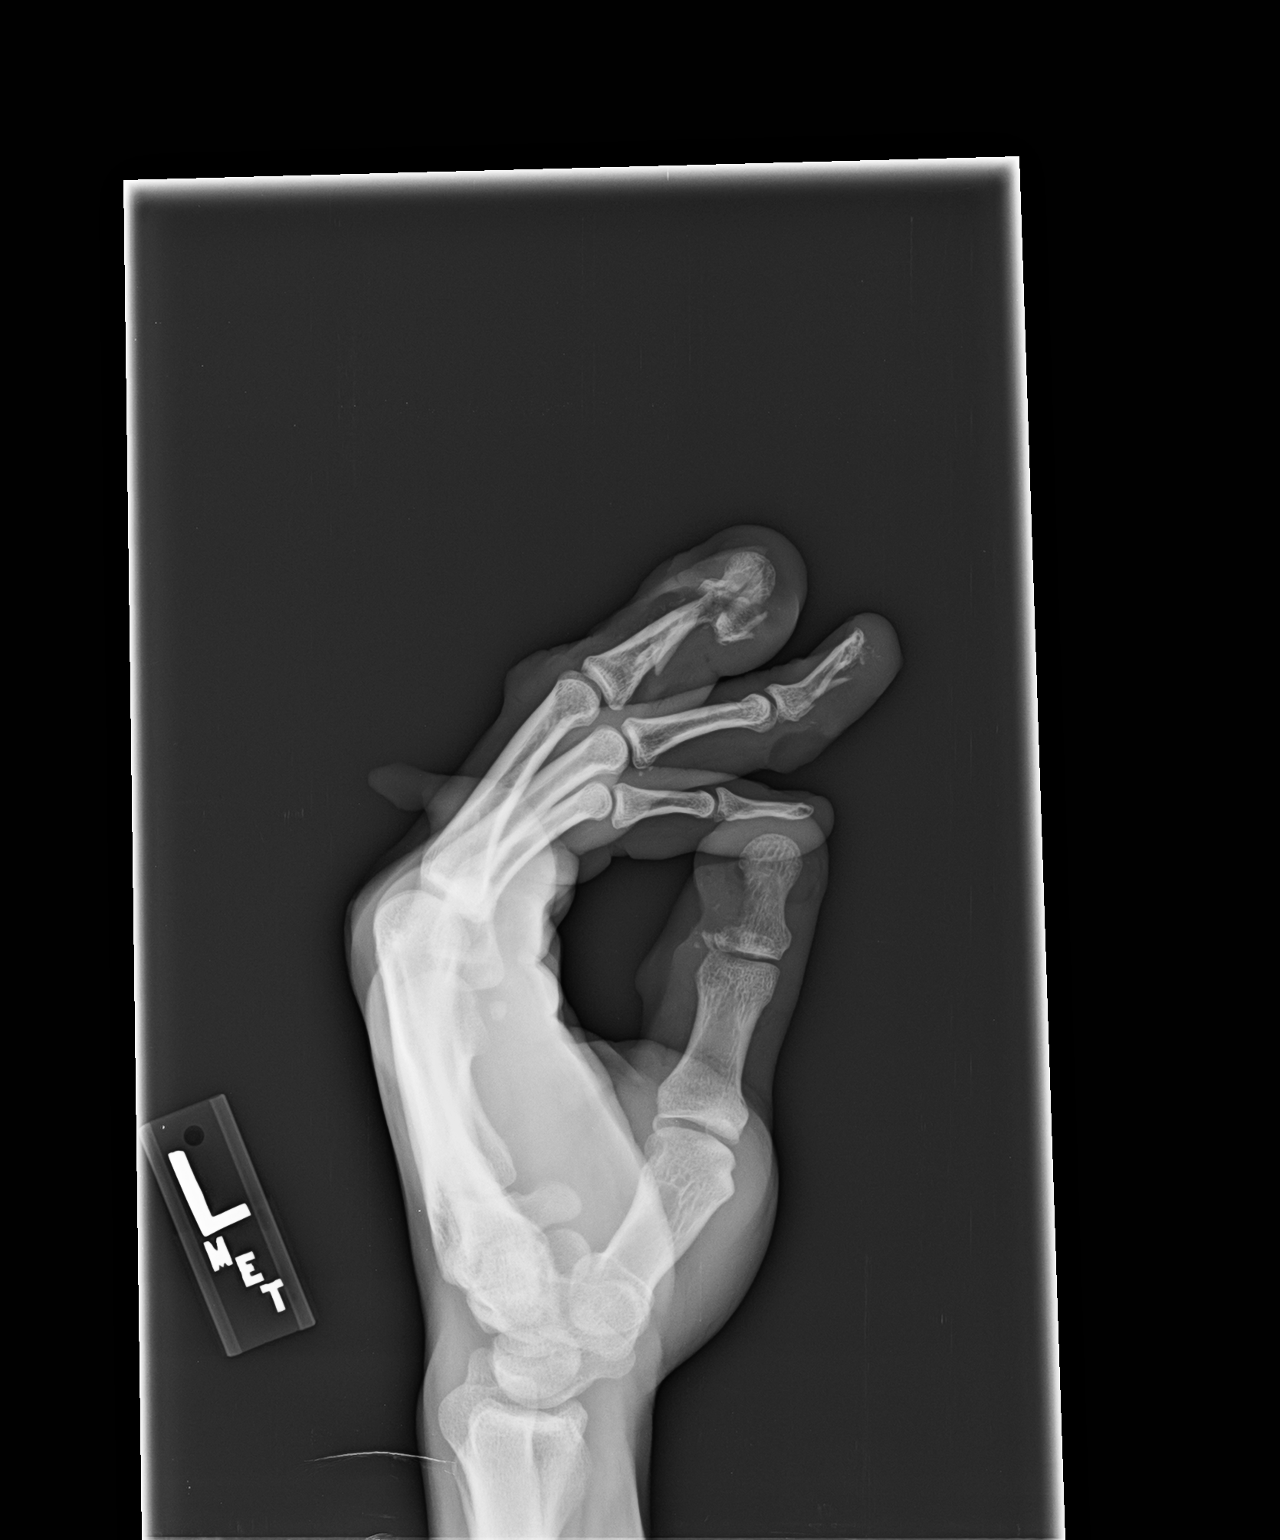

[2 of 2 positions shown; findings below may reference images not displayed]

DIAGNOSTIC STUDIES

EXAM

Two-view left hand

INDICATION

Left hand pain after a chainsaw injury.

TECHNIQUE

Frontal and lateral views

COMPARISONS

None

FINDINGS

Evaluation of the carpus and proximal metacarpal is limited due to poor positioning related to
patient pain.

There is comminuted displaced fracture of the ulnar aspect of the thumb distal phalangeal base
with small fracture fragments and adjacent soft tissue injury.

There has been traumatic amputation of the index finger at the level of the proximal [DATE] of the
proximal phalanx with comminuted fracture fragments present.

Comminuted displaced fractures of the mid and distal phalanx of the long finger are present with
greatest comminution and displacement of the distal phalanx. Associated soft tissue injury with
laceration.

Comminuted displaced fractures of the mid to distal ring finger phalanges with greatest
displacement and comminution of the distal phalanx. Associated soft tissue injury with laceration.

The little finger appears intact.

IMPRESSION

Comminuted displaced fractures from the thumb through the ring finger including traumatic
amputation of the index finger at the level of the proximal phalanx.

## 2021-06-01 ENCOUNTER — Encounter: Admit: 2021-06-01 | Discharge: 2021-06-01
